# Patient Record
Sex: Female | Born: 1950 | Race: White | Hispanic: No | Smoking: Never smoker
Health system: Southern US, Community
[De-identification: ages and names within clinical notes are randomized; demographics above are authoritative.]

---

## 1995-12-12 HISTORY — PX: BREAST EXCISIONAL BIOPSY: SUR124

## 1996-12-11 HISTORY — PX: BREAST BIOPSY: SHX20

## 1998-06-21 ENCOUNTER — Ambulatory Visit (HOSPITAL_COMMUNITY): Admission: RE | Admit: 1998-06-21 | Discharge: 1998-06-21 | Payer: Self-pay | Admitting: Obstetrics and Gynecology

## 1999-07-22 ENCOUNTER — Ambulatory Visit (HOSPITAL_COMMUNITY): Admission: RE | Admit: 1999-07-22 | Discharge: 1999-07-22 | Payer: Self-pay | Admitting: Surgery

## 1999-07-22 ENCOUNTER — Encounter: Payer: Self-pay | Admitting: Surgery

## 2000-01-31 ENCOUNTER — Encounter: Payer: Self-pay | Admitting: Obstetrics and Gynecology

## 2000-01-31 ENCOUNTER — Encounter: Admission: RE | Admit: 2000-01-31 | Discharge: 2000-01-31 | Payer: Self-pay | Admitting: Obstetrics and Gynecology

## 2000-10-11 ENCOUNTER — Encounter: Payer: Self-pay | Admitting: Obstetrics and Gynecology

## 2000-10-11 ENCOUNTER — Ambulatory Visit (HOSPITAL_COMMUNITY): Admission: RE | Admit: 2000-10-11 | Discharge: 2000-10-11 | Payer: Self-pay | Admitting: Obstetrics and Gynecology

## 2001-02-08 ENCOUNTER — Other Ambulatory Visit: Admission: RE | Admit: 2001-02-08 | Discharge: 2001-02-08 | Payer: Self-pay | Admitting: Obstetrics and Gynecology

## 2002-02-18 ENCOUNTER — Ambulatory Visit (HOSPITAL_COMMUNITY): Admission: RE | Admit: 2002-02-18 | Discharge: 2002-02-18 | Payer: Self-pay | Admitting: Family Medicine

## 2002-02-18 ENCOUNTER — Encounter: Payer: Self-pay | Admitting: Family Medicine

## 2002-02-25 ENCOUNTER — Other Ambulatory Visit: Admission: RE | Admit: 2002-02-25 | Discharge: 2002-02-25 | Payer: Self-pay | Admitting: Gynecology

## 2002-08-27 ENCOUNTER — Encounter: Payer: Self-pay | Admitting: Gynecology

## 2002-09-03 ENCOUNTER — Inpatient Hospital Stay (HOSPITAL_COMMUNITY): Admission: RE | Admit: 2002-09-03 | Discharge: 2002-09-05 | Payer: Self-pay | Admitting: Gynecology

## 2002-09-03 ENCOUNTER — Encounter (INDEPENDENT_AMBULATORY_CARE_PROVIDER_SITE_OTHER): Payer: Self-pay | Admitting: Specialist

## 2003-04-27 ENCOUNTER — Other Ambulatory Visit: Admission: RE | Admit: 2003-04-27 | Discharge: 2003-04-27 | Payer: Self-pay | Admitting: Gynecology

## 2003-11-10 ENCOUNTER — Ambulatory Visit (HOSPITAL_COMMUNITY): Admission: RE | Admit: 2003-11-10 | Discharge: 2003-11-10 | Payer: Self-pay | Admitting: *Deleted

## 2005-02-21 ENCOUNTER — Ambulatory Visit (HOSPITAL_COMMUNITY): Admission: RE | Admit: 2005-02-21 | Discharge: 2005-02-21 | Payer: Self-pay | Admitting: Family Medicine

## 2005-03-06 ENCOUNTER — Other Ambulatory Visit: Admission: RE | Admit: 2005-03-06 | Discharge: 2005-03-06 | Payer: Self-pay | Admitting: Gynecology

## 2006-04-09 ENCOUNTER — Ambulatory Visit (HOSPITAL_COMMUNITY): Admission: RE | Admit: 2006-04-09 | Discharge: 2006-04-09 | Payer: Self-pay | Admitting: Family Medicine

## 2007-05-01 ENCOUNTER — Ambulatory Visit (HOSPITAL_COMMUNITY): Admission: RE | Admit: 2007-05-01 | Discharge: 2007-05-01 | Payer: Self-pay | Admitting: Family Medicine

## 2008-05-05 ENCOUNTER — Ambulatory Visit (HOSPITAL_COMMUNITY): Admission: RE | Admit: 2008-05-05 | Discharge: 2008-05-05 | Payer: Self-pay | Admitting: Family Medicine

## 2009-12-28 ENCOUNTER — Ambulatory Visit (HOSPITAL_COMMUNITY): Admission: RE | Admit: 2009-12-28 | Discharge: 2009-12-28 | Payer: Self-pay | Admitting: Family Medicine

## 2011-01-19 ENCOUNTER — Other Ambulatory Visit (HOSPITAL_COMMUNITY): Payer: Self-pay | Admitting: Family Medicine

## 2011-01-19 DIAGNOSIS — Z1231 Encounter for screening mammogram for malignant neoplasm of breast: Secondary | ICD-10-CM

## 2011-02-07 ENCOUNTER — Ambulatory Visit (HOSPITAL_COMMUNITY)
Admission: RE | Admit: 2011-02-07 | Discharge: 2011-02-07 | Disposition: A | Discharge status: Home / Self Care | Payer: Managed Care, Other (non HMO) | Source: Ambulatory Visit | Attending: Family Medicine | Admitting: Family Medicine

## 2011-02-07 DIAGNOSIS — Z1231 Encounter for screening mammogram for malignant neoplasm of breast: Secondary | ICD-10-CM | POA: Insufficient documentation

## 2011-04-28 NOTE — H&P (Signed)
NAME:  Andrea Mckenzie, Andrea Mckenzie NO.:  [id number]   MEDICAL RECORD NO.:  [id number]                    PATIENT TYPE:  INP   LOCATION:                                       FACILITY:  [facility]   PHYSICIAN:  Gretta Cool, M.D.              DATE OF BIRTH:  05-06-1951   DATE OF ADMISSION:  09/03/2002  DATE OF DISCHARGE:  09/05/2002                                HISTORY & PHYSICAL   CHIEF COMPLAINT:  Leiomyomata encroaching upon the endometrial cavity with  abnormal uterine bleeding.   HISTORY OF PRESENT ILLNESS:  The patient is a 60 year old G2, P2 under the  primary care of Dr. Lupe Carney.  She has had increasingly severe abnormal  uterine bleeding with prolonged and heavy flow.  She has been on hormone  replacement therapy for perimenopausal symptoms and has had no significant  intramenstrual bleeding but exceedingly heavy withdrawal bleeding.  On  saline infusion sonography, she is found to have leiomyomata encroaching  upon the cavity and transmural bulging the serosal surface of the uterus.  She understands the options including no therapy and withdrawal of hormone  replacement therapy, suppression long-term with high dose long cycle such as  transdermal contraception and other medical therapies.  She is not a  candidate for hysteroscopy because of the size of the myomata.   PAST MEDICAL HISTORY:  She has had the usual childhood diseases without  sequelae.  Medical illnesses were none of consequence.  Accidents and  injuries:  None.   CURRENT MEDICATIONS:  Maxzide and Bextra.  The Bextra is for disc disease.   PAST SURGICAL HISTORY:  Cesarean section x2 by Dr. Katrinka Blazing.  Knee surgery by  Dr. Montez Morita.  Disc surgery also by Dr. Montez Morita in 2001.   FAMILY HISTORY:  Father died of throat and lung cancer.  Maternal  grandmother had lymphoma.  Mother has coronary artery disease and  hypertension.  One sister also has hypertension.   REVIEW OF SYSTEMS:  HEENT:   Denies symptoms.  CARDIORESPIRATORY:  Denies  asthma, cough, bronchitis, shortness of breath.  GI/GU:  Denies frequency,  urgency, dysuria, change in bowel habits, food intolerance.   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  Her husband works for [employer].  They have  two children grown and away from home.   PHYSICAL EXAMINATION:  GENERAL APPEARANCE:  A well-developed, well-nourished  white female but massively over ideal weight with very high abdomen to hip  ratio.  VITAL SIGNS:  Blood pressure is 160/92 on Maxzide.  She has been changed to  an A2 blocker and hydrochlorothiazide.  HEENT:  Normocephalic.  Pupils are equal, round, reactive to light and  accommodation.  Fundi not examined.  Oropharynx clear.  NECK:  Supple without mass or thyroid enlargement.  BREASTS:  Soft without mass, nodes  or nipple discharge.  CHEST:  Clear to percussion and auscultation.  CARDIOVASCULAR:  Regular rhythm without murmur or cardiac enlargement.  ABDOMEN:  Soft, scaphoid without mass or organomegaly.  PELVIC:  External genitalia normal female.  Vagina is very snug even  digital.  Her cervix is nulliparous, clean.  Her uterus is anterior, normal  size, shape, and contour.  Adnexa clear.  Rectovaginal confirms.  EXTREMITIES:  Negative.  NEUROLOGIC:  Physiologic.   IMPRESSION:  1. Uterine leiomyomata with abnormal uterine bleeding.  2. Obesity with high abdomen to hip ratio.  3. Hypertension.  4. Disc disease.   RECOMMENDATIONS:  I have discussed the risk to benefit ratio total, all  options with her.  I have recommended onto abdominal hysterectomy and  possible salpingo-oophorectomy.  She understands ovarian conservation will  be the plan unless there is pathology that requires oophorectomy as well.  She understands all the risks, benefits, and alternative therapies.                                               Gretta Cool,  M.D.    CWL/MEDQ  D:  09/12/2002  T:  09/12/2002  Job:  [id number]   cc:   L. Lupe Carney, M.D.

## 2011-04-28 NOTE — Op Note (Signed)
NAME:  Andrea Mckenzie, Andrea Mckenzie                           ACCOUNT NO.:  [id number]   MEDICAL RECORD NO.:  [id number]                   PATIENT TYPE:  INP   LOCATION:  X007                                 FACILITY:  [facility]   PHYSICIAN:  Gretta Cool, M.D.              DATE OF BIRTH:  [birth year]   DATE OF PROCEDURE:  09/03/2002  DATE OF DISCHARGE:                                 OPERATIVE REPORT   PREOPERATIVE DIAGNOSES:  1. Uterine leiomyomata, luminal with abnormal uterine bleeding.  2. Severe menorrhagia.   POSTOPERATIVE DIAGNOSES:  1. Uterine leiomyomata, luminal with abnormal uterine bleeding.  2. Severe menorrhagia.   PROCEDURE:  Abdominal hysterectomy, bilateral salpingo-oophorectomy.   SURGEON:  Gretta Cool, M.D.   ASSISTANT:  Raynald Kemp, M.D.   ANESTHESIA:  General orotracheal.   DESCRIPTION OF PROCEDURE:  Under excellent anesthesia as above with the  patient's abdomen prepped and draped in a sterile field and Foley catheter  draining her bladder, Pfannenstiel incision was made and extended through  the fascia.  The abdominoperitoneum was then opened and the abdomen  explored.  There were no abnormalities in the upper abdomen.  The  examination of the uterus revealed a fibroid uterus with multiple fibroids  present with several transmural fibroids.  There were significant adhesions  from her two previous cesarean sections.  The bladder was released and the  left broad ligament was released from __________ adhesion to the bowel.  The  round ligaments were then cauterized through the ligament and the anterior  leaf of the broad ligament opened.  The posterior leaf was then opened and  the infundibulopelvic vessels clamped, cut, sutured, and tied with 0 Vicryl.  The uterine vessels were then skeletonized, clamped, cut, sutured, and tied  with 0 Vicryl.  The cardinal and uterosacral ligaments were then  progressively clamped, cut, sutured, and tied with 0 Vicryl.  The  cervix was  then excised from the vagina and the vagina closed with a running suture of  0 Vicryl.  At the end of the procedure the pelvic pedicles were all dry.  There was no significant bleeding.  Pelvis was irrigated to remove debris.  The pelvic peritoneum was pulled together with 2-0 Monocryl suture.  Packs  and retractors were then removed and the abdominoperitoneum closed with a  running suture of 0 Monocryl from each angle to the midline.  A 0 Monocryl  suture was then used to close the rectus muscles in the midline.  The fascia  was then approximated with a running suture of 0 Vicryl from  each angle to the midline.  The subcutaneous tissue was approximated with  sutures of 3-0 Vicryl and the skin closed with skin staples and Steri-  Strips.  At the end of the procedure sponge and lap counts were correct.  There were no complications.  The patient was returned to the recovery room  in excellent condition.                                               Gretta Cool, M.D.    CWL/MEDQ  D:  09/03/2002  T:  09/03/2002  Job:  [id number]   cc:   L. Lupe Carney, M.D.

## 2011-04-28 NOTE — Discharge Summary (Signed)
NAME:  Andrea Mckenzie, Andrea Mckenzie                           ACCOUNT NO.:  [id number]   MEDICAL RECORD NO.:  [id number]                   PATIENT TYPE:  INP   LOCATION:  0480                                 FACILITY:  [facility]   PHYSICIAN:  Gretta Cool, M.D.              DATE OF BIRTH:  10-Oct-1951   DATE OF ADMISSION:  09/03/2002  DATE OF DISCHARGE:  09/05/2002                                 DISCHARGE SUMMARY   HISTORY OF PRESENT ILLNESS:  The patient is a 60 year old female, gravida 2,  para 2, who has had increasingly severe abnormal uterine bleeding.  She has  been on hormone replacement therapy for perimenopausal symptoms, and has had  no significant intramenstrual bleeding.  Her menstrual flow, however, is  exceedingly heavy.  On saline infusion sonography it is found that she has a  leiomyomata encroaching upon the cavity, transmural bulging on the serosal  surface of the uterus.  She understands the options, including no therapy  versus withdrawal of hormone therapy as well as suppression of long-term  therapy with high dose longer cycles with transdermal contraception.  She is  not a candidate for hysteroscopy due to the size of the leiomyomata.  She  wishes to proceed with definitive therapy by abdominal hysterectomy and  possible salpingo-oophorectomy.   PHYSICAL EXAMINATION:  CHEST:  Clear to auscultation and percussion.  HEART:  Regular rate and rhythm without murmur, gallop, or cardiac  enlargement.  ABDOMEN:  Soft without masses or organomegaly.  PELVIC:  External genitalia within normal limits for female.  Vagina is very  tight.  The cervix is nulliparous and clean.  The uterus is anterior, normal  size, shape, and contour.  Adnexa bilaterally clear.  Rectovaginal  examination confirms.   IMPRESSION:  1. Uterine leiomyomata with abnormal uterine bleeding.  2. Obesity with high abdominal abdomen to hip ratio.  3. Hypertension.  4. Disk disease.   PLAN:  After risks and  benefits were discussed with the patient, as well as  other options, it was recommended that she proceed to abdominal hysterectomy  and possible salpingo-oophorectomy.  She is now admitted for those  procedures.   LABORATORY DATA:  Admission hemoglobin 13.4, hematocrit 38.7.  The remainder  of her preoperative laboratory work was within normal limits with the  exception of a low potassium on her preoperative laboratory work which was  2.9, and on 09/03/02, the day of surgery, potassium was 4.0.  Urinalysis  showed a large amount of hemoglobin, few epithelials, 11 to 20 red blood  cells.  EKG showed normal sinus rhythm, nonspecific T-wave abnormality.  Chest x-ray showed no active disease.   HOSPITAL COURSE:  The patient underwent abdominal hysterectomy, bilateral  salpingo-oophorectomy, under general anesthesia.  The procedures were  completed without any complications, and the patient was returned to the  recovery room in excellent condition.  Pathology report revealed slight  cervicitis and  squamous metaplasia of the cervix, no dysplasia identified,  disordered proliferative endometrium, no hyperplasia or malignancy  identified, adenomyosis, leiomyomata, intramural and subserosal, benign  uterine serosa, right and left ovaries focal endosalpingosis, no malignancy  identified, right and left fallopian tubes benign paratubal cysts.  Her  postoperative course was without complications, and she was discharged on  the second postoperative day in excellent condition.   ACTIVITY:  No heavy lifting or straining, no vaginal entrance, increase  ambulation as tolerated.   DISCHARGE INSTRUCTIONS:  She is to call for any fever greater than 100.5, or  failure of daily improvement.   DIET:  Regular.   DISCHARGE MEDICATIONS:  1. Vioxx 25 mg or Bextra 10 mg q.d.  2. Darvocet-N 100 one p.o. q.4h. p.r.n. discomfort.   FOLLOWUP:  She is to return to the office in one week for followup.    CONDITION ON DISCHARGE:  Excellent.   FINAL DISCHARGE DIAGNOSES:  1. Uterine leiomyomata with abnormal uterine bleeding, intramural and     subserosal.  2. Adenomyosis.  3. Severe menorrhagia.   PROCEDURE:  Total abdominal hysterectomy and bilateral salpingo-oophorectomy  under general anesthesia.      Matt Holmes, N.P.                          Gretta Cool, M.D.    EMK/MEDQ  D:  10/06/2002  T:  10/06/2002  Job:  [id number]   cc:   L. Lupe Carney, M.D.

## 2011-04-28 NOTE — Discharge Summary (Signed)
   NAME:  Caley, Tiffeny                             ACCOUNT NO.:  [id number]   MEDICAL RECORD NO.:  [id number]                    PATIENT TYPE:   LOCATION:                                       FACILITY:   PHYSICIAN:  Eve M. Key, N.P.                    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                                               Matt Holmes, N.P.    EMK/MEDQ  D:  09/15/2002  T:  09/15/2002  Job:  [id number]

## 2012-05-28 ENCOUNTER — Other Ambulatory Visit (HOSPITAL_COMMUNITY): Payer: Self-pay | Admitting: Family Medicine

## 2012-05-28 DIAGNOSIS — Z1231 Encounter for screening mammogram for malignant neoplasm of breast: Secondary | ICD-10-CM

## 2012-06-25 ENCOUNTER — Ambulatory Visit (HOSPITAL_COMMUNITY)
Admission: RE | Admit: 2012-06-25 | Discharge: 2012-06-25 | Disposition: A | Discharge status: Home / Self Care | Payer: Managed Care, Other (non HMO) | Source: Ambulatory Visit | Attending: Family Medicine | Admitting: Family Medicine

## 2012-06-25 DIAGNOSIS — Z1231 Encounter for screening mammogram for malignant neoplasm of breast: Secondary | ICD-10-CM | POA: Insufficient documentation

## 2012-07-02 ENCOUNTER — Other Ambulatory Visit: Payer: Self-pay | Admitting: Family Medicine

## 2012-07-02 DIAGNOSIS — R928 Other abnormal and inconclusive findings on diagnostic imaging of breast: Secondary | ICD-10-CM

## 2012-07-11 ENCOUNTER — Ambulatory Visit
Admission: RE | Admit: 2012-07-11 | Discharge: 2012-07-11 | Disposition: A | Discharge status: Home / Self Care | Payer: Managed Care, Other (non HMO) | Source: Ambulatory Visit | Attending: Family Medicine | Admitting: Family Medicine

## 2012-07-11 DIAGNOSIS — R928 Other abnormal and inconclusive findings on diagnostic imaging of breast: Secondary | ICD-10-CM

## 2013-07-09 ENCOUNTER — Other Ambulatory Visit (HOSPITAL_COMMUNITY): Payer: Self-pay | Admitting: Family Medicine

## 2013-07-09 DIAGNOSIS — Z1231 Encounter for screening mammogram for malignant neoplasm of breast: Secondary | ICD-10-CM

## 2013-07-22 ENCOUNTER — Ambulatory Visit (HOSPITAL_COMMUNITY)
Admission: RE | Admit: 2013-07-22 | Discharge: 2013-07-22 | Disposition: A | Discharge status: Home / Self Care | Payer: Managed Care, Other (non HMO) | Source: Ambulatory Visit | Attending: Family Medicine | Admitting: Family Medicine

## 2013-07-22 DIAGNOSIS — Z1231 Encounter for screening mammogram for malignant neoplasm of breast: Secondary | ICD-10-CM

## 2014-08-11 ENCOUNTER — Other Ambulatory Visit (HOSPITAL_COMMUNITY): Payer: Self-pay | Admitting: Family Medicine

## 2014-08-11 DIAGNOSIS — Z1231 Encounter for screening mammogram for malignant neoplasm of breast: Secondary | ICD-10-CM

## 2014-08-25 ENCOUNTER — Ambulatory Visit (HOSPITAL_COMMUNITY)
Admission: RE | Admit: 2014-08-25 | Discharge: 2014-08-25 | Disposition: A | Discharge status: Home / Self Care | Payer: 59 | Source: Ambulatory Visit | Attending: Family Medicine | Admitting: Family Medicine

## 2014-08-25 DIAGNOSIS — Z1231 Encounter for screening mammogram for malignant neoplasm of breast: Secondary | ICD-10-CM | POA: Insufficient documentation

## 2015-08-03 ENCOUNTER — Other Ambulatory Visit (HOSPITAL_COMMUNITY): Payer: Self-pay | Admitting: Family Medicine

## 2015-08-03 DIAGNOSIS — Z1231 Encounter for screening mammogram for malignant neoplasm of breast: Secondary | ICD-10-CM

## 2015-08-31 ENCOUNTER — Ambulatory Visit (HOSPITAL_COMMUNITY): Payer: Managed Care, Other (non HMO)

## 2015-09-08 ENCOUNTER — Ambulatory Visit (HOSPITAL_COMMUNITY)
Admission: RE | Admit: 2015-09-08 | Discharge: 2015-09-08 | Disposition: A | Discharge status: Home / Self Care | Payer: Managed Care, Other (non HMO) | Source: Ambulatory Visit | Attending: Family Medicine | Admitting: Family Medicine

## 2015-09-08 DIAGNOSIS — Z1231 Encounter for screening mammogram for malignant neoplasm of breast: Secondary | ICD-10-CM | POA: Diagnosis present

## 2016-03-22 ENCOUNTER — Other Ambulatory Visit: Payer: Self-pay | Admitting: Family Medicine

## 2017-02-22 DIAGNOSIS — E7800 Pure hypercholesterolemia, unspecified: Secondary | ICD-10-CM | POA: Diagnosis not present

## 2017-02-22 DIAGNOSIS — E119 Type 2 diabetes mellitus without complications: Secondary | ICD-10-CM | POA: Diagnosis not present

## 2017-02-22 DIAGNOSIS — M150 Primary generalized (osteo)arthritis: Secondary | ICD-10-CM | POA: Diagnosis not present

## 2017-02-22 DIAGNOSIS — I10 Essential (primary) hypertension: Secondary | ICD-10-CM | POA: Diagnosis not present

## 2017-02-22 DIAGNOSIS — N951 Menopausal and female climacteric states: Secondary | ICD-10-CM | POA: Diagnosis not present

## 2017-02-26 DIAGNOSIS — L089 Local infection of the skin and subcutaneous tissue, unspecified: Secondary | ICD-10-CM | POA: Diagnosis not present

## 2017-03-20 DIAGNOSIS — M8588 Other specified disorders of bone density and structure, other site: Secondary | ICD-10-CM | POA: Diagnosis not present

## 2017-03-20 DIAGNOSIS — Z780 Asymptomatic menopausal state: Secondary | ICD-10-CM | POA: Diagnosis not present

## 2017-04-26 DIAGNOSIS — L089 Local infection of the skin and subcutaneous tissue, unspecified: Secondary | ICD-10-CM | POA: Diagnosis not present

## 2017-06-19 DIAGNOSIS — S82141D Displaced bicondylar fracture of right tibia, subsequent encounter for closed fracture with routine healing: Secondary | ICD-10-CM | POA: Diagnosis not present

## 2017-07-23 ENCOUNTER — Other Ambulatory Visit: Payer: Self-pay

## 2017-07-23 ENCOUNTER — Other Ambulatory Visit: Payer: Self-pay | Admitting: Family Medicine

## 2017-07-23 DIAGNOSIS — Z1231 Encounter for screening mammogram for malignant neoplasm of breast: Secondary | ICD-10-CM

## 2017-08-02 DIAGNOSIS — H2513 Age-related nuclear cataract, bilateral: Secondary | ICD-10-CM | POA: Diagnosis not present

## 2017-08-02 DIAGNOSIS — H524 Presbyopia: Secondary | ICD-10-CM | POA: Diagnosis not present

## 2017-08-02 DIAGNOSIS — E119 Type 2 diabetes mellitus without complications: Secondary | ICD-10-CM | POA: Diagnosis not present

## 2017-08-02 DIAGNOSIS — H52203 Unspecified astigmatism, bilateral: Secondary | ICD-10-CM | POA: Diagnosis not present

## 2017-08-02 DIAGNOSIS — H5213 Myopia, bilateral: Secondary | ICD-10-CM | POA: Diagnosis not present

## 2017-08-04 DIAGNOSIS — H1089 Other conjunctivitis: Secondary | ICD-10-CM | POA: Diagnosis not present

## 2017-08-11 DIAGNOSIS — B029 Zoster without complications: Secondary | ICD-10-CM | POA: Diagnosis not present

## 2017-08-15 DIAGNOSIS — Z01818 Encounter for other preprocedural examination: Secondary | ICD-10-CM | POA: Diagnosis not present

## 2017-08-15 DIAGNOSIS — R9431 Abnormal electrocardiogram [ECG] [EKG]: Secondary | ICD-10-CM | POA: Diagnosis not present

## 2017-08-15 DIAGNOSIS — T8484XA Pain due to internal orthopedic prosthetic devices, implants and grafts, initial encounter: Secondary | ICD-10-CM | POA: Diagnosis not present

## 2017-08-15 DIAGNOSIS — Z7982 Long term (current) use of aspirin: Secondary | ICD-10-CM | POA: Diagnosis not present

## 2017-08-15 DIAGNOSIS — Z8619 Personal history of other infectious and parasitic diseases: Secondary | ICD-10-CM | POA: Diagnosis not present

## 2017-08-20 ENCOUNTER — Ambulatory Visit
Admission: RE | Admit: 2017-08-20 | Discharge: 2017-08-20 | Disposition: A | Discharge status: Home / Self Care | Payer: PPO | Source: Ambulatory Visit | Attending: Family Medicine | Admitting: Family Medicine

## 2017-08-20 DIAGNOSIS — Z1231 Encounter for screening mammogram for malignant neoplasm of breast: Secondary | ICD-10-CM

## 2017-08-24 DIAGNOSIS — L988 Other specified disorders of the skin and subcutaneous tissue: Secondary | ICD-10-CM | POA: Diagnosis not present

## 2017-08-24 DIAGNOSIS — Z472 Encounter for removal of internal fixation device: Secondary | ICD-10-CM | POA: Diagnosis not present

## 2017-08-24 DIAGNOSIS — T8489XA Other specified complication of internal orthopedic prosthetic devices, implants and grafts, initial encounter: Secondary | ICD-10-CM | POA: Diagnosis not present

## 2017-08-24 DIAGNOSIS — T8484XA Pain due to internal orthopedic prosthetic devices, implants and grafts, initial encounter: Secondary | ICD-10-CM | POA: Diagnosis not present

## 2017-10-10 DIAGNOSIS — M150 Primary generalized (osteo)arthritis: Secondary | ICD-10-CM | POA: Diagnosis not present

## 2017-10-10 DIAGNOSIS — I10 Essential (primary) hypertension: Secondary | ICD-10-CM | POA: Diagnosis not present

## 2017-10-10 DIAGNOSIS — E7800 Pure hypercholesterolemia, unspecified: Secondary | ICD-10-CM | POA: Diagnosis not present

## 2017-10-10 DIAGNOSIS — E119 Type 2 diabetes mellitus without complications: Secondary | ICD-10-CM | POA: Diagnosis not present

## 2017-10-10 DIAGNOSIS — Z23 Encounter for immunization: Secondary | ICD-10-CM | POA: Diagnosis not present

## 2018-04-09 DIAGNOSIS — E7800 Pure hypercholesterolemia, unspecified: Secondary | ICD-10-CM | POA: Diagnosis not present

## 2018-04-09 DIAGNOSIS — Z23 Encounter for immunization: Secondary | ICD-10-CM | POA: Diagnosis not present

## 2018-04-09 DIAGNOSIS — I10 Essential (primary) hypertension: Secondary | ICD-10-CM | POA: Diagnosis not present

## 2018-04-09 DIAGNOSIS — M150 Primary generalized (osteo)arthritis: Secondary | ICD-10-CM | POA: Diagnosis not present

## 2018-04-09 DIAGNOSIS — E119 Type 2 diabetes mellitus without complications: Secondary | ICD-10-CM | POA: Diagnosis not present

## 2018-08-16 DIAGNOSIS — H5213 Myopia, bilateral: Secondary | ICD-10-CM | POA: Diagnosis not present

## 2018-08-16 DIAGNOSIS — H10023 Other mucopurulent conjunctivitis, bilateral: Secondary | ICD-10-CM | POA: Diagnosis not present

## 2018-08-16 DIAGNOSIS — H524 Presbyopia: Secondary | ICD-10-CM | POA: Diagnosis not present

## 2018-08-16 DIAGNOSIS — H2513 Age-related nuclear cataract, bilateral: Secondary | ICD-10-CM | POA: Diagnosis not present

## 2018-08-16 DIAGNOSIS — E119 Type 2 diabetes mellitus without complications: Secondary | ICD-10-CM | POA: Diagnosis not present

## 2018-08-16 DIAGNOSIS — H52203 Unspecified astigmatism, bilateral: Secondary | ICD-10-CM | POA: Diagnosis not present

## 2018-09-02 ENCOUNTER — Other Ambulatory Visit: Payer: Self-pay | Admitting: Family Medicine

## 2018-09-02 DIAGNOSIS — Z1231 Encounter for screening mammogram for malignant neoplasm of breast: Secondary | ICD-10-CM

## 2018-10-04 ENCOUNTER — Ambulatory Visit
Admission: RE | Admit: 2018-10-04 | Discharge: 2018-10-04 | Disposition: A | Discharge status: Home / Self Care | Payer: PPO | Source: Ambulatory Visit | Attending: Family Medicine | Admitting: Family Medicine

## 2018-10-04 DIAGNOSIS — M150 Primary generalized (osteo)arthritis: Secondary | ICD-10-CM | POA: Diagnosis not present

## 2018-10-04 DIAGNOSIS — I10 Essential (primary) hypertension: Secondary | ICD-10-CM | POA: Diagnosis not present

## 2018-10-04 DIAGNOSIS — Z1231 Encounter for screening mammogram for malignant neoplasm of breast: Secondary | ICD-10-CM | POA: Diagnosis not present

## 2018-10-04 DIAGNOSIS — E119 Type 2 diabetes mellitus without complications: Secondary | ICD-10-CM | POA: Diagnosis not present

## 2018-10-04 DIAGNOSIS — Z23 Encounter for immunization: Secondary | ICD-10-CM | POA: Diagnosis not present

## 2018-10-04 DIAGNOSIS — E7800 Pure hypercholesterolemia, unspecified: Secondary | ICD-10-CM | POA: Diagnosis not present

## 2019-02-10 DIAGNOSIS — K5730 Diverticulosis of large intestine without perforation or abscess without bleeding: Secondary | ICD-10-CM | POA: Diagnosis not present

## 2019-02-10 DIAGNOSIS — K640 First degree hemorrhoids: Secondary | ICD-10-CM | POA: Diagnosis not present

## 2019-02-10 DIAGNOSIS — D123 Benign neoplasm of transverse colon: Secondary | ICD-10-CM | POA: Diagnosis not present

## 2019-02-10 DIAGNOSIS — Z86010 Personal history of colonic polyps: Secondary | ICD-10-CM | POA: Diagnosis not present

## 2019-02-12 DIAGNOSIS — D123 Benign neoplasm of transverse colon: Secondary | ICD-10-CM | POA: Diagnosis not present

## 2019-04-07 DIAGNOSIS — E669 Obesity, unspecified: Secondary | ICD-10-CM | POA: Diagnosis not present

## 2019-04-07 DIAGNOSIS — I10 Essential (primary) hypertension: Secondary | ICD-10-CM | POA: Diagnosis not present

## 2019-04-07 DIAGNOSIS — E1169 Type 2 diabetes mellitus with other specified complication: Secondary | ICD-10-CM | POA: Diagnosis not present

## 2019-04-07 DIAGNOSIS — E7800 Pure hypercholesterolemia, unspecified: Secondary | ICD-10-CM | POA: Diagnosis not present

## 2019-04-07 DIAGNOSIS — Z7984 Long term (current) use of oral hypoglycemic drugs: Secondary | ICD-10-CM | POA: Diagnosis not present

## 2019-04-24 DIAGNOSIS — Z7984 Long term (current) use of oral hypoglycemic drugs: Secondary | ICD-10-CM | POA: Diagnosis not present

## 2019-04-24 DIAGNOSIS — E1169 Type 2 diabetes mellitus with other specified complication: Secondary | ICD-10-CM | POA: Diagnosis not present

## 2019-04-24 DIAGNOSIS — E7800 Pure hypercholesterolemia, unspecified: Secondary | ICD-10-CM | POA: Diagnosis not present

## 2019-10-08 DIAGNOSIS — E7800 Pure hypercholesterolemia, unspecified: Secondary | ICD-10-CM | POA: Diagnosis not present

## 2019-10-08 DIAGNOSIS — I10 Essential (primary) hypertension: Secondary | ICD-10-CM | POA: Diagnosis not present

## 2019-10-08 DIAGNOSIS — E119 Type 2 diabetes mellitus without complications: Secondary | ICD-10-CM | POA: Diagnosis not present

## 2019-10-08 DIAGNOSIS — M150 Primary generalized (osteo)arthritis: Secondary | ICD-10-CM | POA: Diagnosis not present

## 2019-12-16 DIAGNOSIS — H2513 Age-related nuclear cataract, bilateral: Secondary | ICD-10-CM | POA: Diagnosis not present

## 2019-12-16 DIAGNOSIS — H5213 Myopia, bilateral: Secondary | ICD-10-CM | POA: Diagnosis not present

## 2019-12-16 DIAGNOSIS — H524 Presbyopia: Secondary | ICD-10-CM | POA: Diagnosis not present

## 2019-12-16 DIAGNOSIS — E119 Type 2 diabetes mellitus without complications: Secondary | ICD-10-CM | POA: Diagnosis not present

## 2019-12-16 DIAGNOSIS — H52203 Unspecified astigmatism, bilateral: Secondary | ICD-10-CM | POA: Diagnosis not present

## 2019-12-31 ENCOUNTER — Other Ambulatory Visit: Payer: Self-pay | Admitting: Family Medicine

## 2019-12-31 ENCOUNTER — Other Ambulatory Visit: Payer: Self-pay

## 2019-12-31 DIAGNOSIS — Z1231 Encounter for screening mammogram for malignant neoplasm of breast: Secondary | ICD-10-CM

## 2020-02-01 ENCOUNTER — Ambulatory Visit: Payer: PPO | Attending: Internal Medicine

## 2020-02-01 DIAGNOSIS — Z23 Encounter for immunization: Secondary | ICD-10-CM | POA: Insufficient documentation

## 2020-02-01 NOTE — Progress Notes (Signed)
   Covid-19 Vaccination Clinic  Name:  SELVA NOONER    MRN: [id number] DOB: Jul 11, 1951  02/01/2020  Ms. Nevers was observed post Covid-19 immunization for 15 minutes without incidence. She was provided with Vaccine Information Sheet and instruction to access the V-Safe system.   Ms. Scorza was instructed to call 911 with any severe reactions post vaccine: Marland Kitchen Difficulty breathing  . Swelling of your face and throat  . A fast heartbeat  . A bad rash all over your body  . Dizziness and weakness    Immunizations Administered    Name Date Dose VIS Date Route   Pfizer COVID-19 Vaccine 02/01/2020  8:32 AM 0.3 mL 11/21/2019 Intramuscular   Manufacturer: [place]   Lot: [id number]   [place]: [id number]

## 2020-02-06 ENCOUNTER — Other Ambulatory Visit: Payer: Self-pay

## 2020-02-06 ENCOUNTER — Ambulatory Visit
Admission: RE | Admit: 2020-02-06 | Discharge: 2020-02-06 | Disposition: A | Discharge status: Home / Self Care | Payer: PPO | Source: Ambulatory Visit | Attending: Family Medicine | Admitting: Family Medicine

## 2020-02-06 DIAGNOSIS — Z1231 Encounter for screening mammogram for malignant neoplasm of breast: Secondary | ICD-10-CM | POA: Diagnosis not present

## 2020-02-24 ENCOUNTER — Ambulatory Visit: Payer: PPO | Attending: Internal Medicine

## 2020-02-24 DIAGNOSIS — Z23 Encounter for immunization: Secondary | ICD-10-CM

## 2020-02-24 NOTE — Progress Notes (Signed)
   Covid-19 Vaccination Clinic  Name:  REDENA BUGAJ    MRN: [id number] DOB: Jun 09, 1951  02/24/2020  Ms. Kist was observed post Covid-19 immunization for 15 minutes without incident. She was provided with Vaccine Information Sheet and instruction to access the V-Safe system.   Ms. Schoolman was instructed to call 911 with any severe reactions post vaccine: Marland Kitchen Difficulty breathing  . Swelling of face and throat  . A fast heartbeat  . A bad rash all over body  . Dizziness and weakness   Immunizations Administered    Name Date Dose VIS Date Route   Pfizer COVID-19 Vaccine 02/24/2020  3:31 PM 0.3 mL 11/21/2019 Intramuscular   Manufacturer: [place]   Lot: [id number]   [place]: [id number]

## 2020-04-15 DIAGNOSIS — E7800 Pure hypercholesterolemia, unspecified: Secondary | ICD-10-CM | POA: Diagnosis not present

## 2020-04-15 DIAGNOSIS — E1169 Type 2 diabetes mellitus with other specified complication: Secondary | ICD-10-CM | POA: Diagnosis not present

## 2020-04-15 DIAGNOSIS — M150 Primary generalized (osteo)arthritis: Secondary | ICD-10-CM | POA: Diagnosis not present

## 2020-04-15 DIAGNOSIS — Z0000 Encounter for general adult medical examination without abnormal findings: Secondary | ICD-10-CM | POA: Diagnosis not present

## 2020-04-15 DIAGNOSIS — M720 Palmar fascial fibromatosis [Dupuytren]: Secondary | ICD-10-CM | POA: Diagnosis not present

## 2020-04-15 DIAGNOSIS — E669 Obesity, unspecified: Secondary | ICD-10-CM | POA: Diagnosis not present

## 2020-04-15 DIAGNOSIS — I10 Essential (primary) hypertension: Secondary | ICD-10-CM | POA: Diagnosis not present

## 2020-04-17 DIAGNOSIS — S0502XA Injury of conjunctiva and corneal abrasion without foreign body, left eye, initial encounter: Secondary | ICD-10-CM | POA: Diagnosis not present

## 2020-05-31 DIAGNOSIS — Z111 Encounter for screening for respiratory tuberculosis: Secondary | ICD-10-CM | POA: Diagnosis not present

## 2020-10-21 DIAGNOSIS — D489 Neoplasm of uncertain behavior, unspecified: Secondary | ICD-10-CM | POA: Diagnosis not present

## 2020-10-21 DIAGNOSIS — M150 Primary generalized (osteo)arthritis: Secondary | ICD-10-CM | POA: Diagnosis not present

## 2020-10-21 DIAGNOSIS — E119 Type 2 diabetes mellitus without complications: Secondary | ICD-10-CM | POA: Diagnosis not present

## 2020-10-21 DIAGNOSIS — I10 Essential (primary) hypertension: Secondary | ICD-10-CM | POA: Diagnosis not present

## 2020-10-21 DIAGNOSIS — E7800 Pure hypercholesterolemia, unspecified: Secondary | ICD-10-CM | POA: Diagnosis not present

## 2020-10-21 DIAGNOSIS — D692 Other nonthrombocytopenic purpura: Secondary | ICD-10-CM | POA: Diagnosis not present

## 2021-01-01 IMAGING — MG DIGITAL SCREENING BILAT W/ TOMO W/ CAD
8 of 14 series · 8 of 40 positions shown · non-contrast
Comparison: Previous exam(s).

CLINICAL DATA: Screening.

EXAM:
DIGITAL SCREENING BILATERAL MAMMOGRAM WITH TOMO AND CAD

[R MLO synth-2D (1 of 2)]
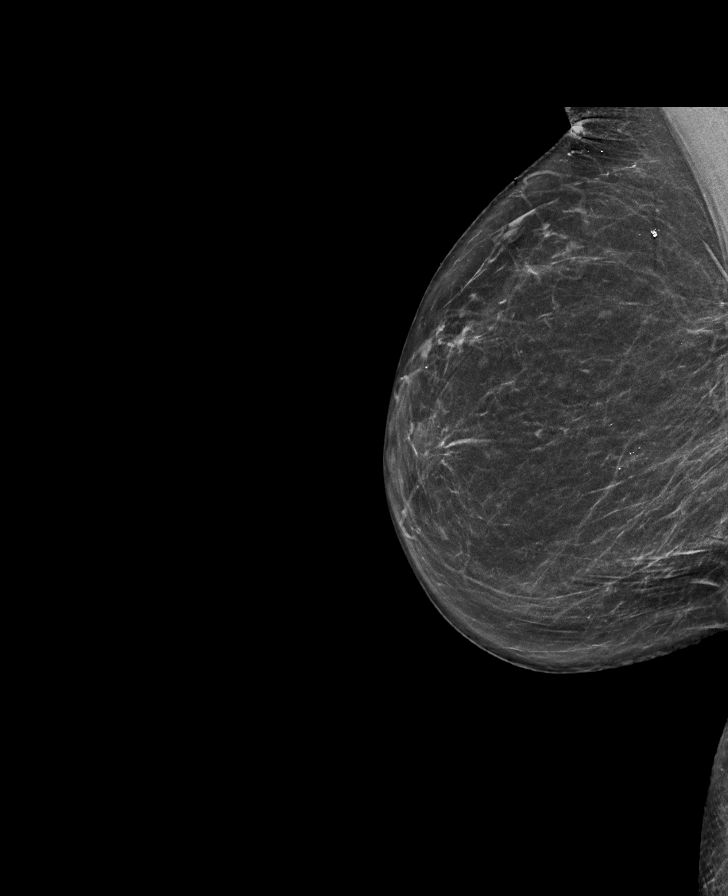

[L CC synth-2D (1 of 2)]
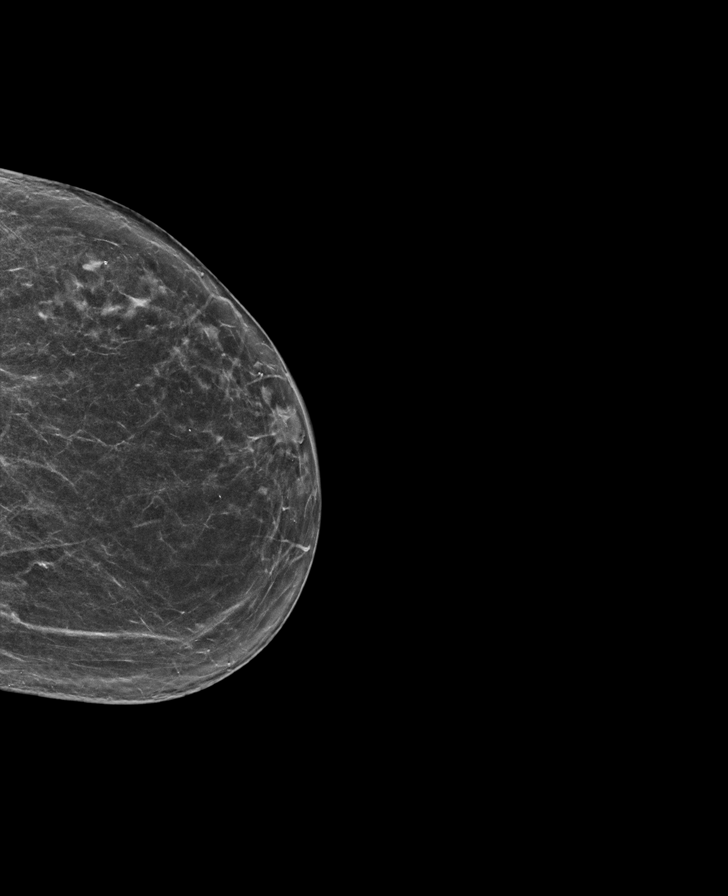

[R CC synth-2D]
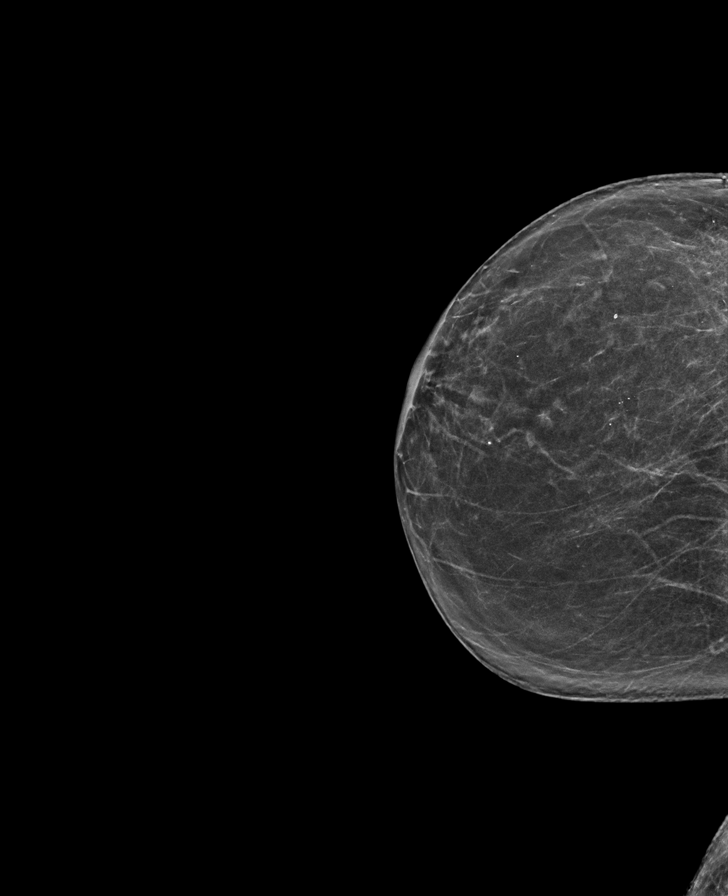

[R MLO synth-2D (2 of 2)]
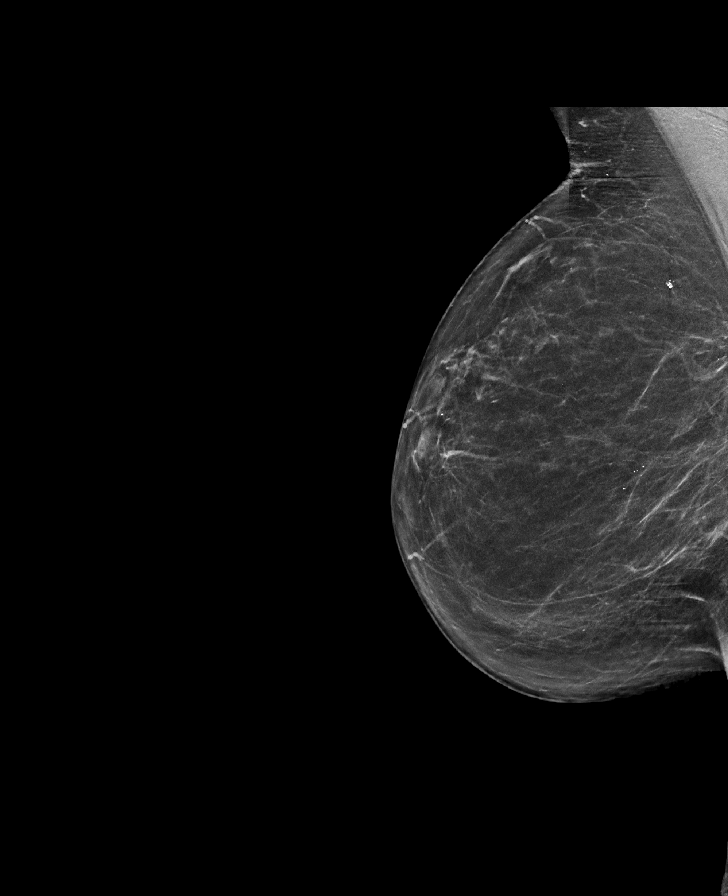

[L MLO synth-2D (1 of 2)]
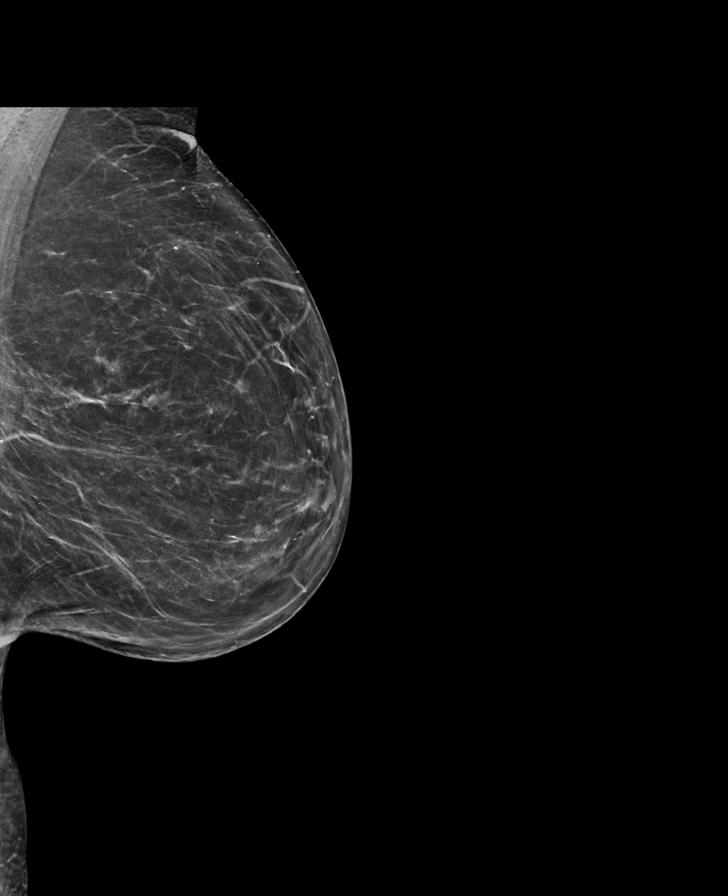

[L MLO synth-2D (2 of 2)]
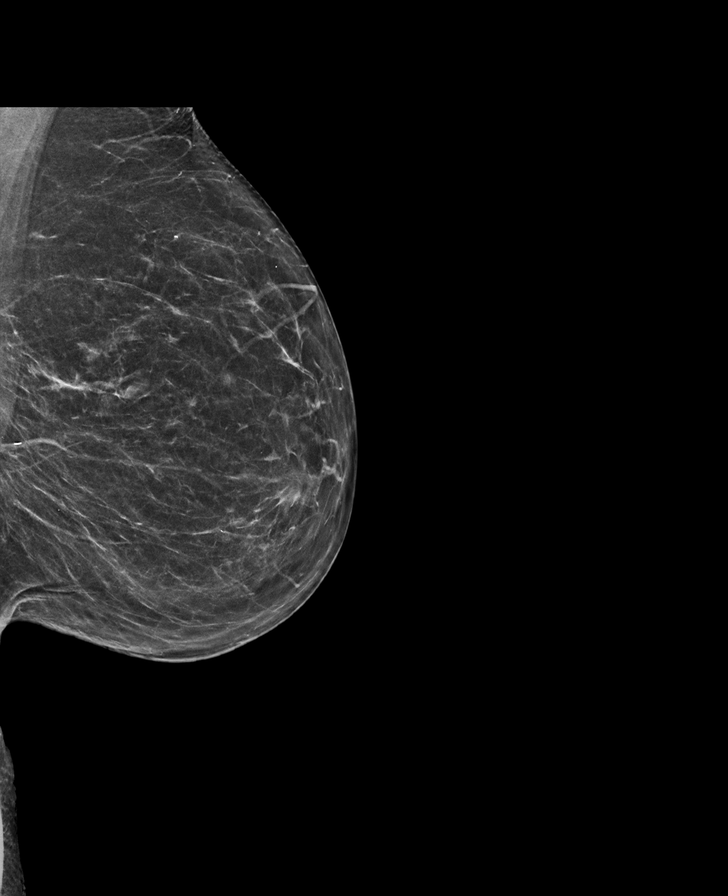

[L CC synth-2D (2 of 2)]
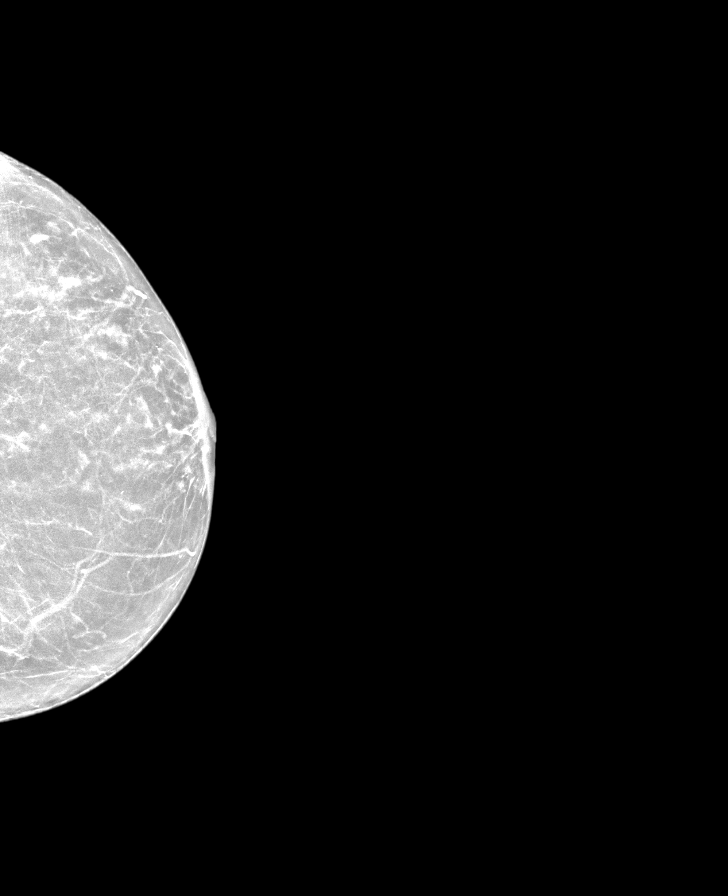

[R MLO tomo · tomo slice 35/68.0]
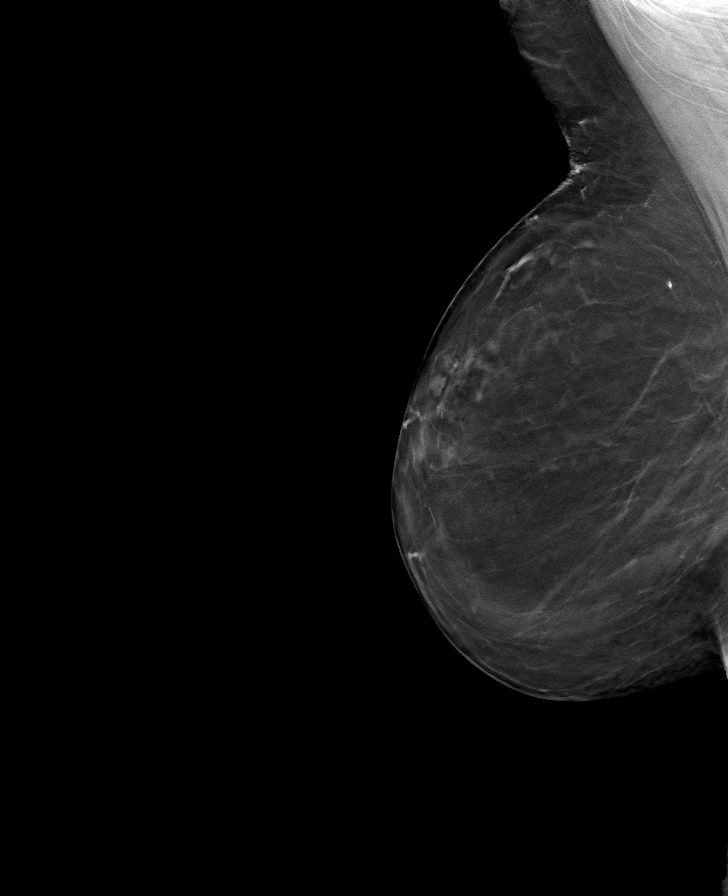

[8 of 40 positions shown; findings below may reference images not displayed]

ACR Breast Density Category b: There are scattered areas of
fibroglandular density.
FINDINGS: There are no findings suspicious for malignancy. Images were
processed with CAD.
IMPRESSION: No mammographic evidence of malignancy. A result letter of this
screening mammogram will be mailed directly to the patient.

RECOMMENDATION:
Screening mammogram in one year. (Code:CN-U-775)

BI-RADS CATEGORY  1: Negative.

## 2021-01-31 DIAGNOSIS — Z7984 Long term (current) use of oral hypoglycemic drugs: Secondary | ICD-10-CM | POA: Diagnosis not present

## 2021-01-31 DIAGNOSIS — E119 Type 2 diabetes mellitus without complications: Secondary | ICD-10-CM | POA: Diagnosis not present

## 2021-01-31 DIAGNOSIS — H52203 Unspecified astigmatism, bilateral: Secondary | ICD-10-CM | POA: Diagnosis not present

## 2021-01-31 DIAGNOSIS — H5213 Myopia, bilateral: Secondary | ICD-10-CM | POA: Diagnosis not present

## 2021-01-31 DIAGNOSIS — H524 Presbyopia: Secondary | ICD-10-CM | POA: Diagnosis not present

## 2021-01-31 DIAGNOSIS — H2513 Age-related nuclear cataract, bilateral: Secondary | ICD-10-CM | POA: Diagnosis not present

## 2021-03-28 ENCOUNTER — Other Ambulatory Visit: Payer: Self-pay | Admitting: Family Medicine

## 2021-03-28 DIAGNOSIS — Z1231 Encounter for screening mammogram for malignant neoplasm of breast: Secondary | ICD-10-CM

## 2021-03-30 ENCOUNTER — Inpatient Hospital Stay: Admission: RE | Admit: 2021-03-30 | Payer: PPO | Source: Ambulatory Visit

## 2021-03-31 ENCOUNTER — Other Ambulatory Visit: Payer: Self-pay

## 2021-03-31 ENCOUNTER — Ambulatory Visit
Admission: RE | Admit: 2021-03-31 | Discharge: 2021-03-31 | Disposition: A | Discharge status: Home / Self Care | Payer: PPO | Source: Ambulatory Visit | Attending: Family Medicine | Admitting: Family Medicine

## 2021-03-31 DIAGNOSIS — Z1231 Encounter for screening mammogram for malignant neoplasm of breast: Secondary | ICD-10-CM | POA: Diagnosis not present

## 2021-05-16 DIAGNOSIS — M150 Primary generalized (osteo)arthritis: Secondary | ICD-10-CM | POA: Diagnosis not present

## 2021-05-16 DIAGNOSIS — D692 Other nonthrombocytopenic purpura: Secondary | ICD-10-CM | POA: Diagnosis not present

## 2021-05-16 DIAGNOSIS — E1169 Type 2 diabetes mellitus with other specified complication: Secondary | ICD-10-CM | POA: Diagnosis not present

## 2021-05-16 DIAGNOSIS — I10 Essential (primary) hypertension: Secondary | ICD-10-CM | POA: Diagnosis not present

## 2021-05-16 DIAGNOSIS — M8580 Other specified disorders of bone density and structure, unspecified site: Secondary | ICD-10-CM | POA: Diagnosis not present

## 2021-05-16 DIAGNOSIS — E7800 Pure hypercholesterolemia, unspecified: Secondary | ICD-10-CM | POA: Diagnosis not present

## 2021-05-16 DIAGNOSIS — Z0000 Encounter for general adult medical examination without abnormal findings: Secondary | ICD-10-CM | POA: Diagnosis not present

## 2021-05-18 ENCOUNTER — Other Ambulatory Visit: Payer: Self-pay | Admitting: Family Medicine

## 2021-05-18 ENCOUNTER — Other Ambulatory Visit: Payer: Self-pay

## 2021-05-18 DIAGNOSIS — E2839 Other primary ovarian failure: Secondary | ICD-10-CM

## 2021-05-18 DIAGNOSIS — M8580 Other specified disorders of bone density and structure, unspecified site: Secondary | ICD-10-CM

## 2021-10-24 DIAGNOSIS — M542 Cervicalgia: Secondary | ICD-10-CM | POA: Diagnosis not present

## 2021-10-24 DIAGNOSIS — M5412 Radiculopathy, cervical region: Secondary | ICD-10-CM | POA: Diagnosis not present

## 2021-12-06 ENCOUNTER — Ambulatory Visit
Admission: RE | Admit: 2021-12-06 | Discharge: 2021-12-06 | Disposition: A | Discharge status: Home / Self Care | Payer: PPO | Source: Ambulatory Visit | Attending: Family Medicine | Admitting: Family Medicine

## 2021-12-06 DIAGNOSIS — M85851 Other specified disorders of bone density and structure, right thigh: Secondary | ICD-10-CM | POA: Diagnosis not present

## 2021-12-06 DIAGNOSIS — Z780 Asymptomatic menopausal state: Secondary | ICD-10-CM | POA: Diagnosis not present

## 2021-12-06 DIAGNOSIS — E2839 Other primary ovarian failure: Secondary | ICD-10-CM

## 2021-12-06 DIAGNOSIS — M8580 Other specified disorders of bone density and structure, unspecified site: Secondary | ICD-10-CM

## 2021-12-07 DIAGNOSIS — M5412 Radiculopathy, cervical region: Secondary | ICD-10-CM | POA: Diagnosis not present

## 2022-01-16 DIAGNOSIS — E7800 Pure hypercholesterolemia, unspecified: Secondary | ICD-10-CM | POA: Diagnosis not present

## 2022-01-16 DIAGNOSIS — E1169 Type 2 diabetes mellitus with other specified complication: Secondary | ICD-10-CM | POA: Diagnosis not present

## 2022-01-16 DIAGNOSIS — I10 Essential (primary) hypertension: Secondary | ICD-10-CM | POA: Diagnosis not present

## 2022-01-16 DIAGNOSIS — M150 Primary generalized (osteo)arthritis: Secondary | ICD-10-CM | POA: Diagnosis not present

## 2022-01-30 ENCOUNTER — Encounter (INDEPENDENT_AMBULATORY_CARE_PROVIDER_SITE_OTHER): Payer: Self-pay

## 2022-01-30 DIAGNOSIS — L570 Actinic keratosis: Secondary | ICD-10-CM | POA: Diagnosis not present

## 2022-01-30 DIAGNOSIS — H35371 Puckering of macula, right eye: Secondary | ICD-10-CM | POA: Diagnosis not present

## 2022-01-30 DIAGNOSIS — E1136 Type 2 diabetes mellitus with diabetic cataract: Secondary | ICD-10-CM | POA: Diagnosis not present

## 2022-01-30 DIAGNOSIS — Z23 Encounter for immunization: Secondary | ICD-10-CM | POA: Diagnosis not present

## 2022-01-30 DIAGNOSIS — H2513 Age-related nuclear cataract, bilateral: Secondary | ICD-10-CM | POA: Diagnosis not present

## 2022-01-31 ENCOUNTER — Ambulatory Visit (INDEPENDENT_AMBULATORY_CARE_PROVIDER_SITE_OTHER): Payer: PPO | Admitting: Ophthalmology

## 2022-01-31 ENCOUNTER — Other Ambulatory Visit: Payer: Self-pay

## 2022-01-31 ENCOUNTER — Encounter (INDEPENDENT_AMBULATORY_CARE_PROVIDER_SITE_OTHER): Payer: Self-pay | Admitting: Ophthalmology

## 2022-01-31 DIAGNOSIS — H2513 Age-related nuclear cataract, bilateral: Secondary | ICD-10-CM

## 2022-01-31 DIAGNOSIS — H35371 Puckering of macula, right eye: Secondary | ICD-10-CM | POA: Diagnosis not present

## 2022-01-31 NOTE — Progress Notes (Addendum)
01/31/2022     CHIEF COMPLAINT Patient presents for  Chief Complaint  Patient presents with   Retina Evaluation      HISTORY OF PRESENT ILLNESS: Andrea Mckenzie is a 71 y.o. female who presents to the clinic today for:   HPI     Retina Evaluation           Laterality: right eye         Comments   NP- ERM/ macular pucker OD. Huge shift in prescription, referred by Dr. Gershon Crane. Pt saw Dr Gershon Crane yesterday 01/30/22 at 9:45 AM. Pt came in today informing me that her vision has been cloudy, but she states as of 2pm she removed her contact lenses from her eyes, and she put OTC lubricating eye drops in her eyes, and then another contact lens fell out of her eye. Pt states the onset of blurred vision in the right eye has been approx. 3-4 weeks. Pt states she is seeing better now than she was before, since finding another contact lens in her eye.  Pt states she is Type 2 Diabetic, diagnosed 10-15 years ago. Last A1C: 8.0 on Feb 6,2023 pt states due to taking prednisone. Pt used to take Metformin, she stopped in the beginning of February. LBS: 140 this morning, fasting.      Last edited by Laurin Coder on 01/31/2022  2:53 PM.      Referring physician: Rutherford Guys, [place],  [place] [id number]  HISTORICAL INFORMATION:   Selected notes from the MEDICAL RECORD NUMBER       CURRENT MEDICATIONS: No current outpatient medications on file. (Ophthalmic Drugs)   No current facility-administered medications for this visit. (Ophthalmic Drugs)   No current outpatient medications on file. (Other)   No current facility-administered medications for this visit. (Other)      REVIEW OF SYSTEMS:    ALLERGIES Allergies  Allergen Reactions   Sulfa Antibiotics     PAST MEDICAL HISTORY History reviewed. No pertinent past medical history. Past Surgical History:  Procedure Laterality Date   BREAST BIOPSY Right 1998   BREAST EXCISIONAL BIOPSY Right  1997    FAMILY HISTORY History reviewed. No pertinent family history.  SOCIAL HISTORY Social History   Tobacco Use   Smoking status: Never    Passive exposure: Never   Smokeless tobacco: Never  Substance Use Topics   Alcohol use: Never         OPHTHALMIC EXAM:  Base Eye Exam     Visual Acuity (ETDRS)       Right Left   Dist cc 20/30 -2 20/30 -1    Correction: Glasses         Tonometry (Tonopen, 2:54 PM)       Right Left   Pressure 14 11         Pupils       Pupils Dark Light Shape APD   Right PERRL 4 3 Round None   Left PERRL 4 4 Round None         Extraocular Movement       Right Left    Full Full         Neuro/Psych     Oriented x3: Yes   Mood/Affect: Normal         Dilation     Both eyes: 1.0% Mydriacyl, 2.5% Phenylephrine @ 2:54 PM           Slit Lamp and Fundus Exam  External Exam       Right Left   External Normal Normal         Slit Lamp Exam       Right Left   Lids/Lashes Normal Normal   Conjunctiva/Sclera White and quiet White and quiet   Cornea Clear Clear   Anterior Chamber Deep and quiet Deep and quiet   Iris Round and reactive Round and reactive   Lens 1+ Nuclear sclerosis 1+ Nuclear sclerosis   Anterior Vitreous Normal Normal         Fundus Exam       Right Left   Posterior Vitreous Posterior vitreous detachment Posterior vitreous detachment   Disc Normal Normal   C/D Ratio 0.25 0.25   Macula Epiretinal membrane, mild to moderate topographic distortion Normal   Vessels Normal Normal   Periphery Normal Normal            IMAGING AND PROCEDURES  Imaging and Procedures for 01/31/22  OCT, Retina - OU - Both Eyes       Right Eye Quality was good. Scan locations included subfoveal. Central Foveal Thickness: 360. Progression has no prior data. Findings include abnormal foveal contour, epiretinal membrane.   Left Eye Quality was good. Scan locations included subfoveal. Central Foveal  Thickness: 273. Progression has no prior data. Findings include normal foveal contour.   Notes Mild to moderate topographic distortion however OD With no intrinsic cystoid macular edema nor schisis nor disturbance in the outer retina     Color Fundus Photography Optos - OU - Both Eyes       Right Eye Progression has no prior data. Disc findings include normal observations. Macula : epiretinal membrane. Vessels : normal observations. Periphery : normal observations.   Left Eye Progression has no prior data. Disc findings include normal observations. Macula : normal observations. Vessels : normal observations. Periphery : normal observations.              ASSESSMENT/PLAN:  Nuclear sclerotic cataract of both eyes OU mild to moderate, not likely impactful on acuity and with equal vision today.  With spectacle correction, Snellen, ET DRS acuity today was normal that is equal in each eye at 20/30 however that 20/30 vision would be coincident and patible with the degree of lens opacity present today    Right epiretinal membrane The nature of macular pucker (epiretinal membrane ERM) was discussed with the patient as well as threshold criteria for vitrectomy surgery. I explained that in rare cases another surgery is needed to actually remove a second wrinkle should it regrow.  Most often, the epiretinal membrane and underlying wrinkled internal limiting membrane are removed with the first surgery, to accomplish the goals.   If the operative eye is Phakic (natural lens still present), cataract surgery is often recommended prior to Vitrectomy. This will enable the retina surgeon to have the best view during surgery and the patient to obtain optimal results in the future. Treatment options were discussed.  I have recommended at home monitoring the near vision task in a monocular (1 eye at a time), with or without near vision glasses, to look for changes or declines in reading.   Right eye  does not fact have an epiretinal membrane that is a macular pucker however there is no anatomic distortion to the macular region that would account for any dramatic shifts in the refractive error of the eye.  Notably the patient discovered today at 2 PM that upon taking eye contact lens  the eyes normally with her the normal and she had placed after her recent eye visit but there was a second contact lens upon the corneal surface.  She wonders if this might of induced the "large refractive shift" that was noted at Dr. Kellie Moor recent examination  However patient will be instructed how to monitor for visual impact of the epiretinal membrane in the right eye going forward.  With contact lenses in and reading glasses on I will instruct her in once a week monocular testing of her reading vision.  Therefore she should read a book with the right eye only for 2 minutes in the left eye only for 2 minutes to compare if the ability to read is similar or same.  If 1 eye dramatically worsens particular the right eye, reevaluation would be appropriate      ICD-10-CM   1. Right epiretinal membrane  H35.371 OCT, Retina - OU - Both Eyes    Color Fundus Photography Optos - OU - Both Eyes    2. Nuclear sclerotic cataract of both eyes  H25.13       1.  OU with mild to moderate cataract,  2.  OD with epiretinal membrane not likely causing the huge shift and refractive changes noted in the referral notes from Dr. Kellie Moor office however  3.  The manifest refraction changing from -850 with contact lens out recent examination today now +7.82 if certainly best explained by the patient may have had a retained contact lens in place.  4.  I explained the patient I do not believe the epiretinal membrane is visually significant at this time.  Be delighted to see her again as needed or in 1 year to reevaluate as I did explain these are likely to progress over time in a gradual fashion  Ophthalmic Meds Ordered this visit:   No orders of the defined types were placed in this encounter.      Return in about 1 year (around 01/31/2023) for DILATE OU, OCT.  There are no Patient Instructions on file for this visit.   Explained the diagnoses, plan, and follow up with the patient and they expressed understanding.  Patient expressed understanding of the importance of proper follow up care.   Clent Demark Erian Lariviere M.D. Diseases & Surgery of the Retina and Vitreous [facility] [place] 01/31/22     Abbreviations: M myopia (nearsighted); A astigmatism; H hyperopia (farsighted); P presbyopia; Mrx spectacle prescription;  CTL contact lenses; OD right eye; OS left eye; OU both eyes  XT exotropia; ET esotropia; PEK punctate epithelial keratitis; PEE punctate epithelial erosions; DES dry eye syndrome; MGD meibomian gland dysfunction; ATs artificial tears; PFAT's preservative free artificial tears; Ehrenfeld nuclear sclerotic cataract; PSC posterior subcapsular cataract; ERM epi-retinal membrane; PVD posterior vitreous detachment; RD retinal detachment; DM diabetes mellitus; DR diabetic retinopathy; NPDR non-proliferative diabetic retinopathy; PDR proliferative diabetic retinopathy; CSME clinically significant macular edema; DME diabetic macular edema; dbh dot blot hemorrhages; CWS cotton wool spot; POAG primary open angle glaucoma; C/D cup-to-disc ratio; HVF humphrey visual field; GVF goldmann visual field; OCT optical coherence tomography; IOP intraocular pressure; BRVO Branch retinal vein occlusion; CRVO central retinal vein occlusion; CRAO central retinal artery occlusion; BRAO branch retinal artery occlusion; RT retinal tear; SB scleral buckle; PPV pars plana vitrectomy; VH Vitreous hemorrhage; PRP panretinal laser photocoagulation; IVK intravitreal kenalog; VMT vitreomacular traction; MH Macular hole;  NVD neovascularization of the disc; NVE neovascularization elsewhere; AREDS age related eye disease study; ARMD age related  macular degeneration; POAG primary open angle glaucoma; EBMD epithelial/anterior basement membrane dystrophy; ACIOL anterior chamber intraocular lens; IOL intraocular lens; PCIOL posterior chamber intraocular lens; Phaco/IOL phacoemulsification with intraocular lens placement; Two Strike photorefractive keratectomy; LASIK laser assisted in situ keratomileusis; HTN hypertension; DM diabetes mellitus; COPD chronic obstructive pulmonary disease

## 2022-01-31 NOTE — Assessment & Plan Note (Addendum)
The nature of macular pucker (epiretinal membrane ERM) was discussed with the patient as well as threshold criteria for vitrectomy surgery. I explained that in rare cases another surgery is needed to actually remove a second wrinkle should it regrow.  Most often, the epiretinal membrane and underlying wrinkled internal limiting membrane are removed with the first surgery, to accomplish the goals.   If the operative eye is Phakic (natural lens still present), cataract surgery is often recommended prior to Vitrectomy. This will enable the retina surgeon to have the best view during surgery and the patient to obtain optimal results in the future. Treatment options were discussed.  I have recommended at home monitoring the near vision task in a monocular (1 eye at a time), with or without near vision glasses, to look for changes or declines in reading.   Right eye does not fact have an epiretinal membrane that is a macular pucker however there is no anatomic distortion to the macular region that would account for any dramatic shifts in the refractive error of the eye.  Notably the patient discovered today at 2 PM that upon taking eye contact lens the eyes normally with her the normal and she had placed after her recent eye visit but there was a second contact lens upon the corneal surface.  She wonders if this might of induced the "large refractive shift" that was noted at Dr. Kellie Moor recent examination  However patient will be instructed how to monitor for visual impact of the epiretinal membrane in the right eye going forward.  With contact lenses in and reading glasses on I will instruct her in once a week monocular testing of her reading vision.  Therefore she should read a book with the right eye only for 2 minutes in the left eye only for 2 minutes to compare if the ability to read is similar or same.  If 1 eye dramatically worsens particular the right eye, reevaluation would be appropriate

## 2022-01-31 NOTE — Assessment & Plan Note (Addendum)
OU mild to moderate, not likely impactful on acuity and with equal vision today.  With spectacle correction, Snellen, ET DRS acuity today was normal that is equal in each eye at 20/30 however that 20/30 vision would be coincident and patible with the degree of lens opacity present today

## 2022-04-24 ENCOUNTER — Other Ambulatory Visit: Payer: Self-pay | Admitting: Family Medicine

## 2022-04-24 DIAGNOSIS — Z1231 Encounter for screening mammogram for malignant neoplasm of breast: Secondary | ICD-10-CM

## 2022-05-22 DIAGNOSIS — E669 Obesity, unspecified: Secondary | ICD-10-CM | POA: Diagnosis not present

## 2022-05-22 DIAGNOSIS — Z0000 Encounter for general adult medical examination without abnormal findings: Secondary | ICD-10-CM | POA: Diagnosis not present

## 2022-05-22 DIAGNOSIS — E7800 Pure hypercholesterolemia, unspecified: Secondary | ICD-10-CM | POA: Diagnosis not present

## 2022-05-22 DIAGNOSIS — E1169 Type 2 diabetes mellitus with other specified complication: Secondary | ICD-10-CM | POA: Diagnosis not present

## 2022-05-22 DIAGNOSIS — I10 Essential (primary) hypertension: Secondary | ICD-10-CM | POA: Diagnosis not present

## 2022-05-22 DIAGNOSIS — M8580 Other specified disorders of bone density and structure, unspecified site: Secondary | ICD-10-CM | POA: Diagnosis not present

## 2022-05-22 DIAGNOSIS — M150 Primary generalized (osteo)arthritis: Secondary | ICD-10-CM | POA: Diagnosis not present

## 2022-05-23 ENCOUNTER — Ambulatory Visit
Admission: RE | Admit: 2022-05-23 | Discharge: 2022-05-23 | Disposition: A | Discharge status: Home / Self Care | Payer: PPO | Source: Ambulatory Visit | Attending: Family Medicine | Admitting: Family Medicine

## 2022-05-23 DIAGNOSIS — Z1231 Encounter for screening mammogram for malignant neoplasm of breast: Secondary | ICD-10-CM

## 2022-06-09 DIAGNOSIS — S51812A Laceration without foreign body of left forearm, initial encounter: Secondary | ICD-10-CM | POA: Diagnosis not present

## 2022-08-15 DIAGNOSIS — D128 Benign neoplasm of rectum: Secondary | ICD-10-CM | POA: Diagnosis not present

## 2022-08-15 DIAGNOSIS — D124 Benign neoplasm of descending colon: Secondary | ICD-10-CM | POA: Diagnosis not present

## 2022-08-15 DIAGNOSIS — K648 Other hemorrhoids: Secondary | ICD-10-CM | POA: Diagnosis not present

## 2022-08-15 DIAGNOSIS — K5730 Diverticulosis of large intestine without perforation or abscess without bleeding: Secondary | ICD-10-CM | POA: Diagnosis not present

## 2022-08-15 DIAGNOSIS — Z09 Encounter for follow-up examination after completed treatment for conditions other than malignant neoplasm: Secondary | ICD-10-CM | POA: Diagnosis not present

## 2022-08-15 DIAGNOSIS — Z86010 Personal history of colonic polyps: Secondary | ICD-10-CM | POA: Diagnosis not present

## 2022-08-17 DIAGNOSIS — D124 Benign neoplasm of descending colon: Secondary | ICD-10-CM | POA: Diagnosis not present

## 2022-08-17 DIAGNOSIS — D128 Benign neoplasm of rectum: Secondary | ICD-10-CM | POA: Diagnosis not present

## 2022-12-05 DIAGNOSIS — E1169 Type 2 diabetes mellitus with other specified complication: Secondary | ICD-10-CM | POA: Diagnosis not present

## 2022-12-05 DIAGNOSIS — E7800 Pure hypercholesterolemia, unspecified: Secondary | ICD-10-CM | POA: Diagnosis not present

## 2022-12-05 DIAGNOSIS — M150 Primary generalized (osteo)arthritis: Secondary | ICD-10-CM | POA: Diagnosis not present

## 2022-12-05 DIAGNOSIS — I10 Essential (primary) hypertension: Secondary | ICD-10-CM | POA: Diagnosis not present

## 2022-12-05 DIAGNOSIS — B079 Viral wart, unspecified: Secondary | ICD-10-CM | POA: Diagnosis not present

## 2022-12-05 DIAGNOSIS — R059 Cough, unspecified: Secondary | ICD-10-CM | POA: Diagnosis not present

## 2022-12-24 DIAGNOSIS — J329 Chronic sinusitis, unspecified: Secondary | ICD-10-CM | POA: Diagnosis not present

## 2022-12-24 DIAGNOSIS — J40 Bronchitis, not specified as acute or chronic: Secondary | ICD-10-CM | POA: Diagnosis not present

## 2022-12-24 DIAGNOSIS — J029 Acute pharyngitis, unspecified: Secondary | ICD-10-CM | POA: Diagnosis not present

## 2022-12-24 DIAGNOSIS — R059 Cough, unspecified: Secondary | ICD-10-CM | POA: Diagnosis not present

## 2022-12-24 DIAGNOSIS — H1089 Other conjunctivitis: Secondary | ICD-10-CM | POA: Diagnosis not present

## 2023-02-01 ENCOUNTER — Encounter (INDEPENDENT_AMBULATORY_CARE_PROVIDER_SITE_OTHER): Payer: PPO | Admitting: Ophthalmology

## 2023-02-01 ENCOUNTER — Encounter (INDEPENDENT_AMBULATORY_CARE_PROVIDER_SITE_OTHER): Payer: Self-pay

## 2023-02-09 DIAGNOSIS — S01312A Laceration without foreign body of left ear, initial encounter: Secondary | ICD-10-CM | POA: Diagnosis not present

## 2023-04-09 DIAGNOSIS — J069 Acute upper respiratory infection, unspecified: Secondary | ICD-10-CM | POA: Diagnosis not present

## 2023-05-01 DIAGNOSIS — H25013 Cortical age-related cataract, bilateral: Secondary | ICD-10-CM | POA: Diagnosis not present

## 2023-05-01 DIAGNOSIS — H524 Presbyopia: Secondary | ICD-10-CM | POA: Diagnosis not present

## 2023-05-01 DIAGNOSIS — H5213 Myopia, bilateral: Secondary | ICD-10-CM | POA: Diagnosis not present

## 2023-05-01 DIAGNOSIS — H2513 Age-related nuclear cataract, bilateral: Secondary | ICD-10-CM | POA: Diagnosis not present

## 2023-05-01 DIAGNOSIS — H52203 Unspecified astigmatism, bilateral: Secondary | ICD-10-CM | POA: Diagnosis not present

## 2023-05-01 DIAGNOSIS — E119 Type 2 diabetes mellitus without complications: Secondary | ICD-10-CM | POA: Diagnosis not present

## 2023-05-25 DIAGNOSIS — H5213 Myopia, bilateral: Secondary | ICD-10-CM | POA: Diagnosis not present

## 2023-05-25 DIAGNOSIS — H2513 Age-related nuclear cataract, bilateral: Secondary | ICD-10-CM | POA: Diagnosis not present

## 2023-05-25 DIAGNOSIS — H53001 Unspecified amblyopia, right eye: Secondary | ICD-10-CM | POA: Diagnosis not present

## 2023-05-25 DIAGNOSIS — H35371 Puckering of macula, right eye: Secondary | ICD-10-CM | POA: Diagnosis not present

## 2023-05-25 DIAGNOSIS — H5000 Unspecified esotropia: Secondary | ICD-10-CM | POA: Diagnosis not present

## 2023-05-28 ENCOUNTER — Other Ambulatory Visit: Payer: Self-pay | Admitting: Family Medicine

## 2023-05-28 DIAGNOSIS — Z1231 Encounter for screening mammogram for malignant neoplasm of breast: Secondary | ICD-10-CM

## 2023-05-31 DIAGNOSIS — E1169 Type 2 diabetes mellitus with other specified complication: Secondary | ICD-10-CM | POA: Diagnosis not present

## 2023-05-31 DIAGNOSIS — I10 Essential (primary) hypertension: Secondary | ICD-10-CM | POA: Diagnosis not present

## 2023-05-31 DIAGNOSIS — E7800 Pure hypercholesterolemia, unspecified: Secondary | ICD-10-CM | POA: Diagnosis not present

## 2023-05-31 DIAGNOSIS — M150 Primary generalized (osteo)arthritis: Secondary | ICD-10-CM | POA: Diagnosis not present

## 2023-05-31 DIAGNOSIS — Z0000 Encounter for general adult medical examination without abnormal findings: Secondary | ICD-10-CM | POA: Diagnosis not present

## 2023-05-31 DIAGNOSIS — M8580 Other specified disorders of bone density and structure, unspecified site: Secondary | ICD-10-CM | POA: Diagnosis not present

## 2023-05-31 DIAGNOSIS — E1136 Type 2 diabetes mellitus with diabetic cataract: Secondary | ICD-10-CM | POA: Diagnosis not present

## 2023-05-31 DIAGNOSIS — E669 Obesity, unspecified: Secondary | ICD-10-CM | POA: Diagnosis not present

## 2023-06-04 DIAGNOSIS — Z961 Presence of intraocular lens: Secondary | ICD-10-CM | POA: Diagnosis not present

## 2023-06-04 DIAGNOSIS — H25811 Combined forms of age-related cataract, right eye: Secondary | ICD-10-CM | POA: Diagnosis not present

## 2023-06-04 DIAGNOSIS — H2511 Age-related nuclear cataract, right eye: Secondary | ICD-10-CM | POA: Diagnosis not present

## 2023-06-18 DIAGNOSIS — Z961 Presence of intraocular lens: Secondary | ICD-10-CM | POA: Diagnosis not present

## 2023-06-18 DIAGNOSIS — H25812 Combined forms of age-related cataract, left eye: Secondary | ICD-10-CM | POA: Diagnosis not present

## 2023-06-18 DIAGNOSIS — H2512 Age-related nuclear cataract, left eye: Secondary | ICD-10-CM | POA: Diagnosis not present

## 2023-07-02 ENCOUNTER — Ambulatory Visit: Admission: RE | Admit: 2023-07-02 | Payer: PPO | Source: Ambulatory Visit

## 2023-07-02 DIAGNOSIS — Z1231 Encounter for screening mammogram for malignant neoplasm of breast: Secondary | ICD-10-CM

## 2023-09-04 DIAGNOSIS — L570 Actinic keratosis: Secondary | ICD-10-CM | POA: Diagnosis not present

## 2023-09-04 DIAGNOSIS — D485 Neoplasm of uncertain behavior of skin: Secondary | ICD-10-CM | POA: Diagnosis not present

## 2023-09-04 DIAGNOSIS — C44319 Basal cell carcinoma of skin of other parts of face: Secondary | ICD-10-CM | POA: Diagnosis not present

## 2023-10-16 DIAGNOSIS — C44319 Basal cell carcinoma of skin of other parts of face: Secondary | ICD-10-CM | POA: Diagnosis not present

## 2024-06-23 ENCOUNTER — Other Ambulatory Visit: Payer: Self-pay | Admitting: Family Medicine

## 2024-06-23 DIAGNOSIS — Z1231 Encounter for screening mammogram for malignant neoplasm of breast: Secondary | ICD-10-CM

## 2024-07-04 ENCOUNTER — Ambulatory Visit
Admission: RE | Admit: 2024-07-04 | Discharge: 2024-07-04 | Disposition: A | Discharge status: Home / Self Care | Source: Ambulatory Visit | Attending: Family Medicine | Admitting: Family Medicine

## 2024-07-04 DIAGNOSIS — Z1231 Encounter for screening mammogram for malignant neoplasm of breast: Secondary | ICD-10-CM
